# Patient Record
Sex: Female | Born: 1980 | Race: White | Hispanic: No | Marital: Single | Smoking: Former smoker
Health system: Southern US, Community
[De-identification: ages and names within clinical notes are randomized; demographics above are authoritative.]

## PROBLEM LIST (undated history)

## (undated) DIAGNOSIS — K859 Acute pancreatitis without necrosis or infection, unspecified: Secondary | ICD-10-CM

## (undated) HISTORY — PX: CHOLECYSTECTOMY: SHX55

## (undated) HISTORY — PX: TUBAL LIGATION: SHX77

---

## 2021-07-09 ENCOUNTER — Emergency Department (HOSPITAL_BASED_OUTPATIENT_CLINIC_OR_DEPARTMENT_OTHER)
Admission: EM | Admit: 2021-07-09 | Discharge: 2021-07-09 | Disposition: A | Discharge status: Home / Self Care | Payer: Self-pay | Attending: Emergency Medicine | Admitting: Emergency Medicine

## 2021-07-09 ENCOUNTER — Encounter (HOSPITAL_BASED_OUTPATIENT_CLINIC_OR_DEPARTMENT_OTHER): Payer: Self-pay | Admitting: *Deleted

## 2021-07-09 ENCOUNTER — Emergency Department (HOSPITAL_BASED_OUTPATIENT_CLINIC_OR_DEPARTMENT_OTHER): Payer: Self-pay

## 2021-07-09 ENCOUNTER — Other Ambulatory Visit: Payer: Self-pay

## 2021-07-09 DIAGNOSIS — K56699 Other intestinal obstruction unspecified as to partial versus complete obstruction: Secondary | ICD-10-CM

## 2021-07-09 DIAGNOSIS — X58XXXA Exposure to other specified factors, initial encounter: Secondary | ICD-10-CM | POA: Insufficient documentation

## 2021-07-09 DIAGNOSIS — Z20822 Contact with and (suspected) exposure to covid-19: Secondary | ICD-10-CM | POA: Insufficient documentation

## 2021-07-09 DIAGNOSIS — T18108A Unspecified foreign body in esophagus causing other injury, initial encounter: Secondary | ICD-10-CM | POA: Insufficient documentation

## 2021-07-09 HISTORY — DX: Acute pancreatitis without necrosis or infection, unspecified: K85.90

## 2021-07-09 LAB — CBC WITH DIFFERENTIAL/PLATELET
Abs Immature Granulocytes: 0 10*3/uL (ref 0.00–0.07)
Basophils Absolute: 0.1 10*3/uL (ref 0.0–0.1)
Basophils Relative: 1 %
Eosinophils Absolute: 1.5 10*3/uL — ABNORMAL HIGH (ref 0.0–0.5)
Eosinophils Relative: 27 %
HCT: 36.9 % (ref 36.0–46.0)
Hemoglobin: 12.1 g/dL (ref 12.0–15.0)
Immature Granulocytes: 0 %
Lymphocytes Relative: 30 %
Lymphs Abs: 1.6 10*3/uL (ref 0.7–4.0)
MCH: 28.5 pg (ref 26.0–34.0)
MCHC: 32.8 g/dL (ref 30.0–36.0)
MCV: 86.8 fL (ref 80.0–100.0)
Monocytes Absolute: 0.4 10*3/uL (ref 0.1–1.0)
Monocytes Relative: 7 %
Neutro Abs: 1.9 10*3/uL (ref 1.7–7.7)
Neutrophils Relative %: 35 %
Platelets: 295 10*3/uL (ref 150–400)
RBC: 4.25 MIL/uL (ref 3.87–5.11)
RDW: 14.5 % (ref 11.5–15.5)
WBC: 5.5 10*3/uL (ref 4.0–10.5)
nRBC: 0 % (ref 0.0–0.2)

## 2021-07-09 LAB — COMPREHENSIVE METABOLIC PANEL
ALT: 17 U/L (ref 0–44)
AST: 20 U/L (ref 15–41)
Albumin: 4.3 g/dL (ref 3.5–5.0)
Alkaline Phosphatase: 47 U/L (ref 38–126)
Anion gap: 10 (ref 5–15)
BUN: 11 mg/dL (ref 6–20)
CO2: 21 mmol/L — ABNORMAL LOW (ref 22–32)
Calcium: 9 mg/dL (ref 8.9–10.3)
Chloride: 103 mmol/L (ref 98–111)
Creatinine, Ser: 0.66 mg/dL (ref 0.44–1.00)
GFR, Estimated: >60 mL/min (ref >60)
Glucose, Bld: 136 mg/dL — ABNORMAL HIGH (ref 70–99)
Potassium: 3.1 mmol/L — ABNORMAL LOW (ref 3.5–5.1)
Sodium: 134 mmol/L — ABNORMAL LOW (ref 135–145)
Total Bilirubin: 0.4 mg/dL (ref 0.3–1.2)
Total Protein: 7.4 g/dL (ref 6.5–8.1)

## 2021-07-09 LAB — RESP PANEL BY RT-PCR (FLU A&B, COVID) ARPGX2
Influenza A by PCR: NEGATIVE
Influenza B by PCR: NEGATIVE
SARS Coronavirus 2 by RT PCR: NEGATIVE

## 2021-07-09 LAB — PREGNANCY, URINE: Preg Test, Ur: NEGATIVE

## 2021-07-09 MED ORDER — GLUCAGON HCL RDNA (DIAGNOSTIC) 1 MG IJ SOLR
1.0000 mg | Freq: Once | INTRAMUSCULAR | Status: AC
  Administered 2021-07-09: 1 mg via INTRAVENOUS
  Filled 2021-07-09: qty 1

## 2021-07-09 MED ORDER — METOCLOPRAMIDE HCL 5 MG/ML IJ SOLN
10.0000 mg | Freq: Once | INTRAMUSCULAR | Status: AC
  Administered 2021-07-09: 10 mg via INTRAVENOUS
  Filled 2021-07-09: qty 2

## 2021-07-09 MED ORDER — SODIUM CHLORIDE 0.9 % IV BOLUS
1000.0000 mL | Freq: Once | INTRAVENOUS | Status: AC
  Administered 2021-07-09: 1000 mL via INTRAVENOUS

## 2021-07-09 MED ORDER — DIPHENHYDRAMINE HCL 50 MG/ML IJ SOLN
50.0000 mg | Freq: Once | INTRAMUSCULAR | Status: AC
  Administered 2021-07-09: 50 mg via INTRAVENOUS
  Filled 2021-07-09: qty 1

## 2021-07-09 MED ORDER — LIDOCAINE VISCOUS HCL 2 % MT SOLN
15.0000 mL | Freq: Once | OROMUCOSAL | Status: DC
  Filled 2021-07-09: qty 15

## 2021-07-09 MED ORDER — ALUM & MAG HYDROXIDE-SIMETH 200-200-20 MG/5ML PO SUSP
30.0000 mL | Freq: Once | ORAL | Status: DC
  Filled 2021-07-09: qty 30

## 2021-07-09 NOTE — ED Triage Notes (Signed)
C/o meat stuck in throat x 30 mins

## 2021-07-09 NOTE — ED Provider Notes (Signed)
[facility] Provider Note   CSN: [id number] Arrival date & time: 07/09/21  1944     History  Chief Complaint  Patient presents with   Food Bolus    Morgan Best is a 41 y.o. female history of impaction requiring esophageal dilation here with possible food bolus.  Patient states that she was eating some barbecue and had some barbecue chicken and felt that part of it was stuck in her throat.  She try to gag herself and was able to get pieces out.  Patient states that she lives in [place] and required esophageal dilation previously for this.   The history is provided by the patient.      Home Medications Prior to Admission medications   Not on File      Allergies    Patient has no known allergies.    Review of Systems   Review of Systems  HENT:         Trouble swallowing  All other systems reviewed and are negative.  Physical Exam Updated Vital Signs BP (!) 125/54    Pulse 74    Temp 98.4 F (36.9 C) (Oral)    Resp 16    Ht 5\' 4"  (1.626 m)    Wt 75.3 kg    LMP 06/11/2021    SpO2 100%    BMI 28.49 kg/m  Physical Exam Vitals and nursing note reviewed.  Constitutional:      Comments: Uncomfortable  HENT:     Head: Normocephalic.     Nose: Nose normal.     Mouth/Throat:     Comments: No obvious foreign body posterior pharynx  Eyes:     Extraocular Movements: Extraocular movements intact.     Pupils: Pupils are equal, round, and reactive to light.  Cardiovascular:     Rate and Rhythm: Normal rate and regular rhythm.     Pulses: Normal pulses.     Heart sounds: Normal heart sounds.  Pulmonary:     Effort: Pulmonary effort is normal.     Breath sounds: Normal breath sounds.  Abdominal:     General: Abdomen is flat.     Palpations: Abdomen is soft.  Musculoskeletal:        General: Normal range of motion.     Cervical back: Normal range of motion and neck supple.  Skin:    General: Skin is warm.     Capillary Refill: Capillary  refill takes less than 2 seconds.  Neurological:     General: No focal deficit present.     Mental Status: She is alert and oriented to person, place, and time.  Psychiatric:        Mood and Affect: Mood normal.        Behavior: Behavior normal.    ED Results / Procedures / Treatments   Labs (all labs ordered are listed, but only abnormal results are displayed) Labs Reviewed  CBC WITH DIFFERENTIAL/PLATELET - Abnormal; Notable for the following components:      Result Value   Eosinophils Absolute 1.5 (*)    All other components within normal limits  RESP PANEL BY RT-PCR (FLU A&B, COVID) ARPGX2  PREGNANCY, URINE  COMPREHENSIVE METABOLIC PANEL    EKG None  Radiology DG Chest Port 1 View  Result Date: 07/09/2021 CLINICAL DATA:  Food bolus, choking. EXAM: PORTABLE CHEST 1 VIEW COMPARISON:  None. FINDINGS: The heart size and mediastinal contours are within normal limits. Both lungs are clear. The visualized skeletal structures  are unremarkable. IMPRESSION: No active disease. Electronically Signed   By: Ronney Asters M.D.   On: 07/09/2021 20:22    Procedures Procedures    Medications Ordered in ED Medications  alum & mag hydroxide-simeth (MAALOX/MYLANTA) 200-200-20 MG/5ML suspension 30 mL (has no administration in time range)    And  lidocaine (XYLOCAINE) 2 % viscous mouth solution 15 mL (has no administration in time range)  sodium chloride 0.9 % bolus 1,000 mL (1,000 mLs Intravenous New Bag/Given 07/09/21 2018)  metoCLOPramide (REGLAN) injection 10 mg (10 mg Intravenous Given 07/09/21 2018)  diphenhydrAMINE (BENADRYL) injection 50 mg (50 mg Intravenous Given 07/09/21 2020)  glucagon (human recombinant) (GLUCAGEN) injection 1 mg (1 mg Intravenous Given 07/09/21 2021)    ED Course/ Medical Decision Making/ A&P                           Medical Decision Making Morgan Best is a 41 y.o. female here presenting with possible food bolus.  This is a recurrent problem for her.  She  had previous food bolus requiring esophageal dilation.  She swallowed some pieces of chicken. Plan to give glucagon and Reglan and Benadryl and get chest x-ray and reassess  9:22 PM I reassessed patient.  Patient was able to tolerate ginger ale after glucagon and Reglan and Benadryl Patient has follow-up with GI at [facility].  Stable for discharge  Amount and/or Complexity of Data Reviewed Labs: ordered. Decision-making details documented in ED Course. Radiology: ordered and independent interpretation performed.    Details: no obvious foreign body on CXR       Final Clinical Impression(s) / ED Diagnoses Final diagnoses:  None    Rx / DC Orders ED Discharge Orders     None         Drenda Freeze, MD 07/09/21 2123

## 2021-07-09 NOTE — ED Notes (Signed)
ED Provider at bedside. 

## 2021-07-09 NOTE — Discharge Instructions (Signed)
You had a food bolus that passed.  Please eat soft diet for several day  Follow-up with your GI doctor in [place]  Return to ER if you have food stuck in your throat, trouble swallowing, chest pain

## 2021-07-09 NOTE — ED Notes (Signed)
Pt ambulatory to bathroom, no assistance needed.  

## 2021-07-09 NOTE — ED Notes (Signed)
Pt requested Ginger ale for fluid challenge. Pt given the same

## 2022-03-24 ENCOUNTER — Other Ambulatory Visit: Payer: Self-pay

## 2022-03-24 ENCOUNTER — Encounter (HOSPITAL_BASED_OUTPATIENT_CLINIC_OR_DEPARTMENT_OTHER): Payer: Self-pay | Admitting: *Deleted

## 2022-03-24 ENCOUNTER — Emergency Department (HOSPITAL_BASED_OUTPATIENT_CLINIC_OR_DEPARTMENT_OTHER)
Admission: EM | Admit: 2022-03-24 | Discharge: 2022-03-24 | Disposition: A | Discharge status: Home / Self Care | Payer: Medicaid Other | Attending: Emergency Medicine | Admitting: Emergency Medicine

## 2022-03-24 DIAGNOSIS — R300 Dysuria: Secondary | ICD-10-CM | POA: Diagnosis not present

## 2022-03-24 DIAGNOSIS — N898 Other specified noninflammatory disorders of vagina: Secondary | ICD-10-CM | POA: Insufficient documentation

## 2022-03-24 DIAGNOSIS — M545 Low back pain, unspecified: Secondary | ICD-10-CM | POA: Insufficient documentation

## 2022-03-24 DIAGNOSIS — R319 Hematuria, unspecified: Secondary | ICD-10-CM | POA: Diagnosis not present

## 2022-03-24 LAB — URINALYSIS, ROUTINE W REFLEX MICROSCOPIC
Bilirubin Urine: NEGATIVE
Glucose, UA: NEGATIVE mg/dL
Ketones, ur: NEGATIVE mg/dL
Leukocytes,Ua: NEGATIVE
Nitrite: NEGATIVE
Protein, ur: NEGATIVE mg/dL
Specific Gravity, Urine: 1.01 (ref 1.005–1.030)
pH: 5.5 (ref 5.0–8.0)

## 2022-03-24 LAB — WET PREP, GENITAL
Clue Cells Wet Prep HPF POC: NONE SEEN
Sperm: NONE SEEN
Trich, Wet Prep: NONE SEEN
WBC, Wet Prep HPF POC: <10 (ref <10)
Yeast Wet Prep HPF POC: NONE SEEN

## 2022-03-24 LAB — PREGNANCY, URINE: Preg Test, Ur: NEGATIVE

## 2022-03-24 NOTE — ED Triage Notes (Signed)
Pt reports that she thinks she has a UTI, she reports some discomfort with urination x4 days, some itching and some lower back pain.  Pt also reports some blood in the urine.

## 2022-03-24 NOTE — ED Provider Notes (Signed)
[facility] EMERGENCY DEPT Provider Note   CSN: [id number] Arrival date & time: 03/24/22  1348     History  Chief Complaint  Patient presents with   Urinary Tract Infection    Morgan Best is a 41 y.o. female.  Morgan Best is a 41 y.o. female with a history of pancreatitis and abnormal Pap smear, who presents to the ED with concern for possible UTI.  She reports for the past 4 days she has had some burning and discomfort with urination as well as some itching.  She reports she has had some occasional stringy vaginal discharge but no vaginal bleeding.  She has seen some blood in her urine and blood when she wipes after urinating, no rectal bleeding.  She is also had some mild low back pain.  No abdominal or flank pain.  Reports that her last menstrual cycle was about a week ago and was normal.  She denies concern for STD reports she has not been sexually active in 10 months.  Had a Pap smear in May that showed some abnormal cells and has a follow-up colposcopy and was told to follow-up for repeat Pap smear in a year.  The history is provided by the patient and medical records.  Urinary Tract Infection Associated symptoms: no abdominal pain, no fever, no flank pain, no nausea and no vomiting        Home Medications Prior to Admission medications   Not on File      Allergies    Patient has no known allergies.    Review of Systems   Review of Systems  Constitutional:  Negative for chills and fever.  Gastrointestinal:  Negative for abdominal pain, nausea and vomiting.  Genitourinary:  Positive for dysuria and hematuria. Negative for flank pain and frequency.  Musculoskeletal:  Positive for back pain.    Physical Exam Updated Vital Signs BP (!) 104/59 (BP Location: Left Arm)   Pulse (!) 54   Temp 97.7 F (36.5 C)   Resp 16   Wt 75.3 kg   LMP 03/13/2022 (Exact Date)   SpO2 100%   BMI 28.49 kg/m  Physical Exam Vitals and nursing note reviewed.   Constitutional:      General: She is not in acute distress.    Appearance: Normal appearance. She is well-developed. She is not diaphoretic.  HENT:     Head: Normocephalic and atraumatic.  Eyes:     General:        Right eye: No discharge.        Left eye: No discharge.  Cardiovascular:     Rate and Rhythm: Normal rate and regular rhythm.     Pulses: Normal pulses.     Heart sounds: Normal heart sounds.  Pulmonary:     Effort: Pulmonary effort is normal. No respiratory distress.     Breath sounds: Normal breath sounds. No wheezing or rales.     Comments: Respirations equal and unlabored, patient able to speak in full sentences, lungs clear to auscultation bilaterally  Abdominal:     General: Bowel sounds are normal. There is no distension.     Palpations: Abdomen is soft. There is no mass.     Tenderness: There is no abdominal tenderness. There is no right CVA tenderness, left CVA tenderness or guarding.     Comments: Abdomen soft, nondistended, nontender to palpation in all quadrants without guarding or peritoneal signs  Genitourinary:    Comments: Chaperone present during pelvic exam. External genitalia appears  normal. Vagina and cervix appear normal, there is a small amount of stringy clear discharge present, no bleeding, no cervical lesions noted. Musculoskeletal:        General: No deformity.     Cervical back: Neck supple.     Comments: Mild tenderness across the low back without overlying skin changes or palpable warmth, normal range of motion of lower extremities  Skin:    General: Skin is warm and dry.     Capillary Refill: Capillary refill takes less than 2 seconds.  Neurological:     Mental Status: She is alert and oriented to person, place, and time.     Coordination: Coordination normal.     Comments: Speech is clear, able to follow commands Moves extremities without ataxia, coordination intact  Psychiatric:        Mood and Affect: Mood normal.        Behavior:  Behavior normal.     ED Results / Procedures / Treatments   Labs (all labs ordered are listed, but only abnormal results are displayed) Labs Reviewed  URINALYSIS, ROUTINE W REFLEX MICROSCOPIC - Abnormal; Notable for the following components:      Result Value   Color, Urine COLORLESS (*)    Hgb urine dipstick SMALL (*)    Bacteria, UA RARE (*)    All other components within normal limits  WET PREP, GENITAL  URINE CULTURE  PREGNANCY, URINE    EKG None  Radiology No results found.  Procedures Procedures    Medications Ordered in ED Medications - No data to display  ED Course/ Medical Decision Making/ A&P                           Medical Decision Making Amount and/or Complexity of Data Reviewed Labs: ordered.   41 year old female presents with some dysuria and low back pain for the past 4 days as well as some itching.  Has seen some blood in her urine and when wiping, no rectal bleeding.  Differential includes UTI, pyelonephritis, STI, yeast infection, BV, musculoskeletal back pain.  No abdominal tenderness or CVA tenderness.  On pelvic exam patient has a small amount of thin stringy discharge, not sexually active and no concern for STI, no cervical lesions or vaginal bleeding.  Urinalysis without clear signs of infection at this time, some rare bacteria but no leukocytes, nitrates, WBCs or RBCs.  Will send for culture.  Wet prep with no evidence of yeast, BV or WBCs.  I discussed overall reassuring results with patient.  Culture is pending.  If symptoms or not resolving in the next few days we will have her follow-up with her regular doctor or OB/GYN for further evaluation.  Discussed return precautions.  At this time there does not appear to be any evidence of an acute emergency medical condition requiring further emergent evaluation and the patient appears stable for discharge with appropriate outpatient follow up. Diagnosis and return precautions discussed with patient  who verbalizes understanding and is agreeable to discharge.           Final Clinical Impression(s) / ED Diagnoses Final diagnoses:  Dysuria    Rx / DC Orders ED Discharge Orders     None         Jacqlyn Larsen, Hershal Coria 77/82/42 3536    Campbell Stall P, DO 14/43/15 [phone]

## 2022-03-24 NOTE — Discharge Instructions (Signed)
Your urine did not show signs of infection and your wet prep was not positive for yeast or bacterial vaginosis.  We did send your urinalysis for culture and you will be called by phone if it grows out something that requires treatment with antibiotics otherwise follow-up with your primary care doctor or OB/GYN if symptoms continue.

## 2022-03-25 LAB — URINE CULTURE: Culture: <10000 — AB

## 2022-04-21 ENCOUNTER — Emergency Department (HOSPITAL_BASED_OUTPATIENT_CLINIC_OR_DEPARTMENT_OTHER)
Admission: EM | Admit: 2022-04-21 | Discharge: 2022-04-21 | Disposition: A | Discharge status: Home / Self Care | Payer: Commercial Managed Care - HMO | Attending: Emergency Medicine | Admitting: Emergency Medicine

## 2022-04-21 ENCOUNTER — Other Ambulatory Visit: Payer: Self-pay

## 2022-04-21 ENCOUNTER — Other Ambulatory Visit (HOSPITAL_BASED_OUTPATIENT_CLINIC_OR_DEPARTMENT_OTHER): Payer: Self-pay

## 2022-04-21 ENCOUNTER — Encounter (HOSPITAL_BASED_OUTPATIENT_CLINIC_OR_DEPARTMENT_OTHER): Payer: Self-pay | Admitting: Emergency Medicine

## 2022-04-21 DIAGNOSIS — M545 Low back pain, unspecified: Secondary | ICD-10-CM | POA: Insufficient documentation

## 2022-04-21 MED ORDER — DIAZEPAM 2 MG PO TABS
2.0000 mg | ORAL_TABLET | Freq: Once | ORAL | Status: AC
  Administered 2022-04-21: 2 mg via ORAL
  Filled 2022-04-21: qty 1

## 2022-04-21 MED ORDER — ACETAMINOPHEN 500 MG PO TABS
1000.0000 mg | ORAL_TABLET | Freq: Once | ORAL | Status: AC
  Administered 2022-04-21: 1000 mg via ORAL
  Filled 2022-04-21: qty 2

## 2022-04-21 MED ORDER — DICLOFENAC SODIUM 1 % EX GEL
4.0000 g | Freq: Four times a day (QID) | CUTANEOUS | 0 refills | Status: AC
  Filled 2022-04-21: qty 100, 7d supply, fill #0

## 2022-04-21 MED ORDER — KETOROLAC TROMETHAMINE 15 MG/ML IJ SOLN
15.0000 mg | Freq: Once | INTRAMUSCULAR | Status: AC
  Administered 2022-04-21: 15 mg via INTRAMUSCULAR
  Filled 2022-04-21: qty 1

## 2022-04-21 MED ORDER — METHYLPREDNISOLONE 4 MG PO TBPK
ORAL_TABLET | ORAL | 0 refills | Status: AC
  Filled 2022-04-21: qty 21, 6d supply, fill #0

## 2022-04-21 MED ORDER — METHOCARBAMOL 500 MG PO TABS
500.0000 mg | ORAL_TABLET | Freq: Two times a day (BID) | ORAL | 0 refills | Status: AC
  Filled 2022-04-21: qty 5, 3d supply, fill #0

## 2022-04-21 MED ORDER — OXYCODONE HCL 5 MG PO TABS
5.0000 mg | ORAL_TABLET | Freq: Once | ORAL | Status: AC
Start: 2022-04-21 — End: 2022-04-21
  Administered 2022-04-21: 5 mg via ORAL
  Filled 2022-04-21: qty 1

## 2022-04-21 NOTE — ED Provider Notes (Signed)
[place] EMERGENCY DEPT Provider Note   CSN: [id number] Arrival date & time: 04/21/22  1102     History  No chief complaint on file.   Morgan Best is a 41 y.o. female.  41 yo F with a chief complaint of low back pain.  This has been going on for at least 3 weeks maybe a bit longer.  Worse with prolonged sitting certain positions.  Has tried ibuprofen without improvement.  Has tried Tiger balm without improvement.  Denies radiation of the pain denies abdominal pain denies urinary symptoms denies loss of bowel or bladder denies loss or perirectal sensation denies numbness or weakness of the legs.        Home Medications Prior to Admission medications   Medication Sig Start Date End Date Taking? Authorizing Provider  diclofenac Sodium (VOLTAREN) 1 % GEL Apply 4 grams topically 4 (four) times daily. 04/21/22  Yes Deno Etienne, DO  methocarbamol (ROBAXIN) 500 MG tablet Take 1 tablet (500 mg total) by mouth 2 (two) times daily. 04/21/22  Yes Deno Etienne, DO  methylPREDNISolone (MEDROL DOSEPAK) 4 MG TBPK tablet Take as directed on package. 04/21/22  Yes Deno Etienne, DO      Allergies    Patient has no known allergies.    Review of Systems   Review of Systems  Physical Exam Updated Vital Signs BP 121/68 (BP Location: Right Arm)   Pulse (!) 58   Temp 97.6 F (36.4 C) (Oral)   Resp 16   Ht 5\' 4"  (1.626 m)   Wt 81.6 kg   LMP 04/07/2022 (Exact Date)   SpO2 100%   BMI 30.90 kg/m  Physical Exam Vitals and nursing note reviewed.  Constitutional:      General: She is not in acute distress.    Appearance: She is well-developed. She is not diaphoretic.  HENT:     Head: Normocephalic and atraumatic.  Eyes:     Pupils: Pupils are equal, round, and reactive to light.  Cardiovascular:     Rate and Rhythm: Normal rate and regular rhythm.     Heart sounds: No murmur heard.    No friction rub. No gallop.  Pulmonary:     Effort: Pulmonary effort is normal.      Breath sounds: No wheezing or rales.  Abdominal:     General: There is no distension.     Palpations: Abdomen is soft.     Tenderness: There is no abdominal tenderness.  Musculoskeletal:        General: No tenderness.     Cervical back: Normal range of motion and neck supple.     Comments: No midline spinal tenderness step-offs or deformities.  She has some mild pain to the paraspinal musculature worse on the left than the right.  Pulse motor and sensation intact in bilateral lower extremities.  Reflexes 2+ and equal.  No clonus.  Skin:    General: Skin is warm and dry.  Neurological:     Mental Status: She is alert and oriented to person, place, and time.  Psychiatric:        Behavior: Behavior normal.     ED Results / Procedures / Treatments   Labs (all labs ordered are listed, but only abnormal results are displayed) Labs Reviewed - No data to display  EKG None  Radiology No results found.  Procedures Procedures    Medications Ordered in ED Medications  acetaminophen (TYLENOL) tablet 1,000 mg (has no administration in time range)  oxyCODONE (  Oxy IR/ROXICODONE) immediate release tablet 5 mg (has no administration in time range)  ketorolac (TORADOL) 15 MG/ML injection 15 mg (has no administration in time range)  diazepam (VALIUM) tablet 2 mg (has no administration in time range)    ED Course/ Medical Decision Making/ A&P                           Medical Decision Making Risk OTC drugs. Prescription drug management.   41 yo F with a cc of low back pain.  This has been going on for almost a month.  No red flags.  We will treat as musculoskeletal.  PCP follow-up.  1:03 PM:  I have discussed the diagnosis/risks/treatment options with the patient.  Evaluation and diagnostic testing in the emergency department does not suggest an emergent condition requiring admission or immediate intervention beyond what has been performed at this time.  They will follow up with PCP.  We also discussed returning to the ED immediately if new or worsening sx occur. We discussed the sx which are most concerning (e.g., sudden worsening pain, fever, inability to tolerate by mouth) that necessitate immediate return. Medications administered to the patient during their visit and any new prescriptions provided to the patient are listed below.  Medications given during this visit Medications  acetaminophen (TYLENOL) tablet 1,000 mg (has no administration in time range)  oxyCODONE (Oxy IR/ROXICODONE) immediate release tablet 5 mg (has no administration in time range)  ketorolac (TORADOL) 15 MG/ML injection 15 mg (has no administration in time range)  diazepam (VALIUM) tablet 2 mg (has no administration in time range)     The patient appears reasonably screen and/or stabilized for discharge and I doubt any other medical condition or other [facility] requiring further screening, evaluation, or treatment in the ED at this time prior to discharge.          Final Clinical Impression(s) / ED Diagnoses Final diagnoses:  Acute bilateral low back pain without sciatica    Rx / DC Orders ED Discharge Orders          Ordered    methylPREDNISolone (MEDROL DOSEPAK) 4 MG TBPK tablet        04/21/22 1244    methocarbamol (ROBAXIN) 500 MG tablet  2 times daily        04/21/22 1244    diclofenac Sodium (VOLTAREN) 1 % GEL  4 times daily        04/21/22 Raymond, Terrilynn Postell, DO 04/21/22 1303

## 2022-04-21 NOTE — ED Triage Notes (Signed)
Pt via pov from home with lower back pain x 1 month, worsening today. Pt has taken ibuprofen, ice, heat - no relief. Pt denies any trauma or injury. A&O x 4; nad noted.

## 2022-04-21 NOTE — ED Notes (Signed)
Discharge paperwork given and verbally understood. Pt understood the medications that were given may make her feel sleepy. She also understood no driving/drinking or operating heavy equipment.

## 2022-04-21 NOTE — Discharge Instructions (Signed)
Your back pain is most likely due to a muscular strain.  There is been a lot of research on back pain, unfortunately the only thing that seems to really help is Tylenol and ibuprofen.  Relative rest is also important to not lift greater than 10 pounds bending or twisting at the waist.  Please follow-up with your family physician.  The other thing that really seems to benefit patients is physical therapy which your doctor may send you for.  Please return to the emergency department for new numbness or weakness to your arms or legs. Difficulty with urinating or urinating or pooping on yourself.  Also if you cannot feel toilet paper when you wipe or get a fever.   Take the steroids and use the gel as prescribed.  You should not take oral ibuprofen or naproxen while you are taking the steroids. Also take tylenol 1000mg (2 extra strength) four times a day.   [email or web address]

## 2022-10-14 ENCOUNTER — Emergency Department (HOSPITAL_BASED_OUTPATIENT_CLINIC_OR_DEPARTMENT_OTHER)
Admission: EM | Admit: 2022-10-14 | Discharge: 2022-10-14 | Disposition: A | Discharge status: Home / Self Care | Payer: Commercial Managed Care - HMO | Attending: Emergency Medicine | Admitting: Emergency Medicine

## 2022-10-14 ENCOUNTER — Emergency Department (HOSPITAL_BASED_OUTPATIENT_CLINIC_OR_DEPARTMENT_OTHER): Payer: Commercial Managed Care - HMO

## 2022-10-14 ENCOUNTER — Encounter (HOSPITAL_BASED_OUTPATIENT_CLINIC_OR_DEPARTMENT_OTHER): Payer: Self-pay

## 2022-10-14 ENCOUNTER — Other Ambulatory Visit: Payer: Self-pay

## 2022-10-14 DIAGNOSIS — N898 Other specified noninflammatory disorders of vagina: Secondary | ICD-10-CM | POA: Diagnosis not present

## 2022-10-14 DIAGNOSIS — D649 Anemia, unspecified: Secondary | ICD-10-CM | POA: Insufficient documentation

## 2022-10-14 DIAGNOSIS — R110 Nausea: Secondary | ICD-10-CM | POA: Diagnosis not present

## 2022-10-14 DIAGNOSIS — R1032 Left lower quadrant pain: Secondary | ICD-10-CM | POA: Diagnosis not present

## 2022-10-14 DIAGNOSIS — R1031 Right lower quadrant pain: Secondary | ICD-10-CM | POA: Insufficient documentation

## 2022-10-14 DIAGNOSIS — R1030 Lower abdominal pain, unspecified: Secondary | ICD-10-CM

## 2022-10-14 DIAGNOSIS — N9489 Other specified conditions associated with female genital organs and menstrual cycle: Secondary | ICD-10-CM | POA: Diagnosis not present

## 2022-10-14 LAB — BASIC METABOLIC PANEL
Anion gap: 9 (ref 5–15)
BUN: 11 mg/dL (ref 6–20)
CO2: 25 mmol/L (ref 22–32)
Calcium: 9.5 mg/dL (ref 8.9–10.3)
Chloride: 103 mmol/L (ref 98–111)
Creatinine, Ser: 0.56 mg/dL (ref 0.44–1.00)
GFR, Estimated: >60 mL/min (ref >60)
Glucose, Bld: 88 mg/dL (ref 70–99)
Potassium: 4.1 mmol/L (ref 3.5–5.1)
Sodium: 137 mmol/L (ref 135–145)

## 2022-10-14 LAB — CBC
HCT: 33 % — ABNORMAL LOW (ref 36.0–46.0)
Hemoglobin: 10.1 g/dL — ABNORMAL LOW (ref 12.0–15.0)
MCH: 25.5 pg — ABNORMAL LOW (ref 26.0–34.0)
MCHC: 30.6 g/dL (ref 30.0–36.0)
MCV: 83.3 fL (ref 80.0–100.0)
Platelets: 332 10*3/uL (ref 150–400)
RBC: 3.96 MIL/uL (ref 3.87–5.11)
RDW: 15.1 % (ref 11.5–15.5)
WBC: 6.4 10*3/uL (ref 4.0–10.5)
nRBC: 0 % (ref 0.0–0.2)

## 2022-10-14 LAB — URINALYSIS, ROUTINE W REFLEX MICROSCOPIC
Bilirubin Urine: NEGATIVE
Glucose, UA: NEGATIVE mg/dL
Hgb urine dipstick: NEGATIVE
Ketones, ur: NEGATIVE mg/dL
Leukocytes,Ua: NEGATIVE
Nitrite: NEGATIVE
Protein, ur: NEGATIVE mg/dL
Specific Gravity, Urine: 1.014 (ref 1.005–1.030)
Squamous Epithelial / HPF: >50 /HPF (ref 0–5)
pH: 5.5 (ref 5.0–8.0)

## 2022-10-14 LAB — WET PREP, GENITAL
Clue Cells Wet Prep HPF POC: NONE SEEN
Sperm: NONE SEEN
Trich, Wet Prep: NONE SEEN
WBC, Wet Prep HPF POC: <10 (ref <10)
Yeast Wet Prep HPF POC: NONE SEEN

## 2022-10-14 LAB — LIPASE, BLOOD: Lipase: <10 U/L — ABNORMAL LOW (ref 11–51)

## 2022-10-14 LAB — PREGNANCY, URINE: Preg Test, Ur: NEGATIVE

## 2022-10-14 MED ORDER — KETOROLAC TROMETHAMINE 15 MG/ML IJ SOLN
15.0000 mg | Freq: Once | INTRAMUSCULAR | Status: AC
  Administered 2022-10-14: 15 mg via INTRAVENOUS
  Filled 2022-10-14: qty 1

## 2022-10-14 MED ORDER — HYDROCODONE-ACETAMINOPHEN 5-325 MG PO TABS
1.0000 | ORAL_TABLET | Freq: Once | ORAL | Status: AC
  Administered 2022-10-14: 1 via ORAL
  Filled 2022-10-14: qty 1

## 2022-10-14 MED ORDER — IOHEXOL 300 MG/ML  SOLN
100.0000 mL | Freq: Once | INTRAMUSCULAR | Status: AC | PRN
  Administered 2022-10-14: 100 mL via INTRAVENOUS

## 2022-10-14 NOTE — Discharge Instructions (Addendum)
Please follow-up with your primary care doctor, your uterus appears to be swollen today, you may be starting your period soon.  Make sure you return to the ER if you feel like your pain is getting worse, talk to your gynecologist, you may possibly going through perimenopause, however it is unclear at this time.

## 2022-10-14 NOTE — ED Notes (Signed)
Patient transported to CT 

## 2022-10-14 NOTE — ED Notes (Signed)
RN reviewed discharge instructions with pt. Pt verbalized understanding and had no further questions. VSS upon discharge.  

## 2022-10-14 NOTE — ED Provider Notes (Signed)
[facility] AT [facility] Provider Note   CSN: [id number] Arrival date & time: 10/14/22  1253     History  Chief Complaint  Patient presents with   Flank Pain    Morgan Best is a 42 y.o. female, history of pancreatitis, cholecystectomy, who presents to the ED secondary to bilateral lower quadrant abdominal pain radiating to the right side of her back for the last 4 days.  She states initially was intermittent at first, but now has been persistent and has caused severe pain.  She states that she went and saw her doctor at [facility], and was told to come to the ER as they were not able to get a CT scan sooner.  She denies any increased frequency urgency of urination.  Does endorse some vaginal discharge and is concerned that she may have BV, request BV testing.  She states the pain is sharp and stabbing, radiating to bilateral lower quadrants and the back.  States it hurts most in the right back.  Denies history of kidney stones.  Denies concern for STDs--is only sexually active with 1 partner and is not in a monogamous relationship.  Denies any kind of fever, chills, vomiting but does endorse nausea.     Home Medications Prior to Admission medications   Medication Sig Start Date End Date Taking? Authorizing Provider  diclofenac Sodium (VOLTAREN) 1 % GEL Apply 4 grams topically 4 (four) times daily. 04/21/22   Melene Plan, DO  methocarbamol (ROBAXIN) 500 MG tablet Take 1 tablet (500 mg total) by mouth 2 (two) times daily. 04/21/22   Melene Plan, DO  methylPREDNISolone (MEDROL DOSEPAK) 4 MG TBPK tablet Take as directed on package. 04/21/22   Melene Plan, DO      Allergies    Patient has no known allergies.    Review of Systems   Review of Systems  Gastrointestinal:  Negative for vomiting.  Genitourinary:  Positive for flank pain. Negative for difficulty urinating and dysuria.    Physical Exam Updated Vital Signs BP 103/61 (BP Location: Right Wrist)    Pulse 62   Temp (!) 97.5 F (36.4 C) (Oral)   Resp 19   Ht 5\' 4"  (1.626 m)   Wt 102.1 kg   SpO2 100%   BMI 38.62 kg/m  Physical Exam Vitals and nursing note reviewed.  Constitutional:      General: She is not in acute distress.    Appearance: She is well-developed.  HENT:     Head: Normocephalic and atraumatic.  Eyes:     Conjunctiva/sclera: Conjunctivae normal.  Cardiovascular:     Rate and Rhythm: Normal rate and regular rhythm.     Heart sounds: No murmur heard. Pulmonary:     Effort: Pulmonary effort is normal. No respiratory distress.     Breath sounds: Normal breath sounds.  Abdominal:     Palpations: Abdomen is soft.     Tenderness: There is abdominal tenderness in the right lower quadrant and left lower quadrant. There is right CVA tenderness. There is no guarding or rebound.  Musculoskeletal:        General: No swelling.     Cervical back: Neck supple.  Skin:    General: Skin is warm and dry.     Capillary Refill: Capillary refill takes less than 2 seconds.  Neurological:     Mental Status: She is alert.  Psychiatric:        Mood and Affect: Mood normal.     ED  Results / Procedures / Treatments   Labs (all labs ordered are listed, but only abnormal results are displayed) Labs Reviewed  URINALYSIS, ROUTINE W REFLEX MICROSCOPIC - Abnormal; Notable for the following components:      Result Value   APPearance HAZY (*)    Bacteria, UA RARE (*)    All other components within normal limits  CBC - Abnormal; Notable for the following components:   Hemoglobin 10.1 (*)    HCT 33.0 (*)    MCH 25.5 (*)    All other components within normal limits  LIPASE, BLOOD - Abnormal; Notable for the following components:   Lipase <10 (*)    All other components within normal limits  WET PREP, GENITAL  PREGNANCY, URINE  BASIC METABOLIC PANEL    EKG None  Radiology CT ABDOMEN PELVIS W CONTRAST  Result Date: 10/14/2022 CLINICAL DATA:  Right lower quadrant abdominal  pain beginning 4 5 days ago. EXAM: CT ABDOMEN AND PELVIS WITH CONTRAST TECHNIQUE: Multidetector CT imaging of the abdomen and pelvis was performed using the standard protocol following bolus administration of intravenous contrast. RADIATION DOSE REDUCTION: This exam was performed according to the departmental dose-optimization program which includes automated exposure control, adjustment of the mA and/or kV according to patient size and/or use of iterative reconstruction technique. CONTRAST:  OMNIPAQUE IOHEXOL 300 MG/ML  SOLN COMPARISON:  None Available. FINDINGS: Lower chest: The lung bases are clear without focal nodule, mass, or airspace disease. Heart size is normal. No significant pleural or pericardial effusion is present. Hepatobiliary: No focal liver abnormality is seen. Status post cholecystectomy. No biliary dilatation. Pancreas: Unremarkable. No pancreatic ductal dilatation or surrounding inflammatory changes. Spleen: Normal in size without focal abnormality. Adrenals/Urinary Tract: Adrenal glands are normal bilaterally. A 10 mm simple cyst is present in the left kidney. A 8 mm simple cyst is present the right kidney. No other focal lesions are present. No stone or obstruction is present. The ureters are within normal limits bilaterally. The urinary bladder is normal. Stomach/Bowel: Stomach is within normal limits. Appendix appears normal. No evidence of bowel wall thickening, distention, or inflammatory changes. Vascular/Lymphatic: No significant vascular findings are present. No enlarged abdominal or pelvic lymph nodes. Reproductive: Fluid is present in the endometrial canal. Edematous changes are present in the uterus. Head necks are unremarkable. A Morgan Best amount of free fluid is present in the cul-de-sac. Correlate with menstrual cycle. Other: No other significant free fluid is present. A paraumbilical hernia contains fat without bowel. No free air is present. Musculoskeletal: The vertebral body  heights and alignment are normal. No focal osseous lesions are present. The hips are located and within normal limits. IMPRESSION: 1. Edematous changes in the uterus with fluid in the endometrial canal. Correlate with menstrual cycle. 2. Morgan Best amount of free fluid in the cul-de-sac is likely physiologic. 3. Normal appearance of the appendix. 4. No other acute or focal lesion to explain the patient's symptoms. Electronically Signed   By: Marin Roberts M.D.   On: 10/14/2022 17:03    Procedures Procedures    Medications Ordered in ED Medications  ketorolac (TORADOL) 15 MG/ML injection 15 mg (15 mg Intravenous Given 10/14/22 1607)  iohexol (OMNIPAQUE) 300 MG/ML solution 100 mL (100 mLs Intravenous Contrast Given 10/14/22 1613)  HYDROcodone-acetaminophen (NORCO/VICODIN) 5-325 MG per tablet 1 tablet (1 tablet Oral Given 10/14/22 1647)    ED Course/ Medical Decision Making/ A&P  Medical Decision Making Patient is a 42 year old female, here for bilateral lower quadrant pain radiating to the back, for the last 4 days.  States that she was told to come here given her low hemoglobin, by her primary care doctor, as well as to get a CT scan.  She denies any urinary symptoms.  Denies any concern for STDs.  We will obtain a CT scan, as well as basic labs and wet prep for further evaluation.  She is overall well-appearing on exam, has some right-sided back pain, and bilateral lower quadrant pain, without any guarding.  She is well-appearing.  Amount and/or Complexity of Data Reviewed Labs: ordered.    Details: Hemoglobin 10.1 (labs from earlier this week were 9.3 hemoglobin) Radiology: ordered.    Details: CT abdomen pelvis, shows edematous uterus. Discussion of management or test interpretation with external provider(s): Discussed with patient, she has no cervical motion tenderness, she is low risk for STDs that she is monogamous relationship, she does have some edema of her  uterus, per the CT scan.  Her symptoms have been ongoing for 4 days, thus I think torsion is unlikely and she has no signs of guarding or rebound. In not acute distress.  We discussed that she may be menstruating again upcoming, last period was 4/4.  She states she has had irregular periods, night sweats, hot flashes, I encouraged her to follow-up with her OB/GYN for further evaluation, ?perimenopausal?  We discussed using warm water bottle for pain control, and Tylenol and ibuprofen.  Risk Prescription drug management.    Final Clinical Impression(s) / ED Diagnoses Final diagnoses:  Lower abdominal pain  Uterine pain    Rx / DC Orders ED Discharge Orders     None         Tashera Montalvo, [place]ley [place], PA 10/14/22 1733    Arby Barrette, MD 10/15/22 1135

## 2022-10-14 NOTE — ED Triage Notes (Signed)
Patient here POV from Home.  Endorses Right Flank Pain that began 4-5 Days ago. Worsened today. No Discernable Dysuria and some Mild hematuria. No known fevers.   Seen by PCP 2 Days ago and was told she was anemic and possibly had a Renal Stone due to her UA was normal.   NAD Noted during Triage. A&Ox4. GCS 15. Ambulatory.

## 2023-02-21 IMAGING — DX DG CHEST 1V PORT
1 series · 1 of 1 positions shown · non-contrast
Comparison: None.

CLINICAL DATA: Food bolus, choking.

EXAM:
PORTABLE CHEST 1 VIEW

[chest ap]
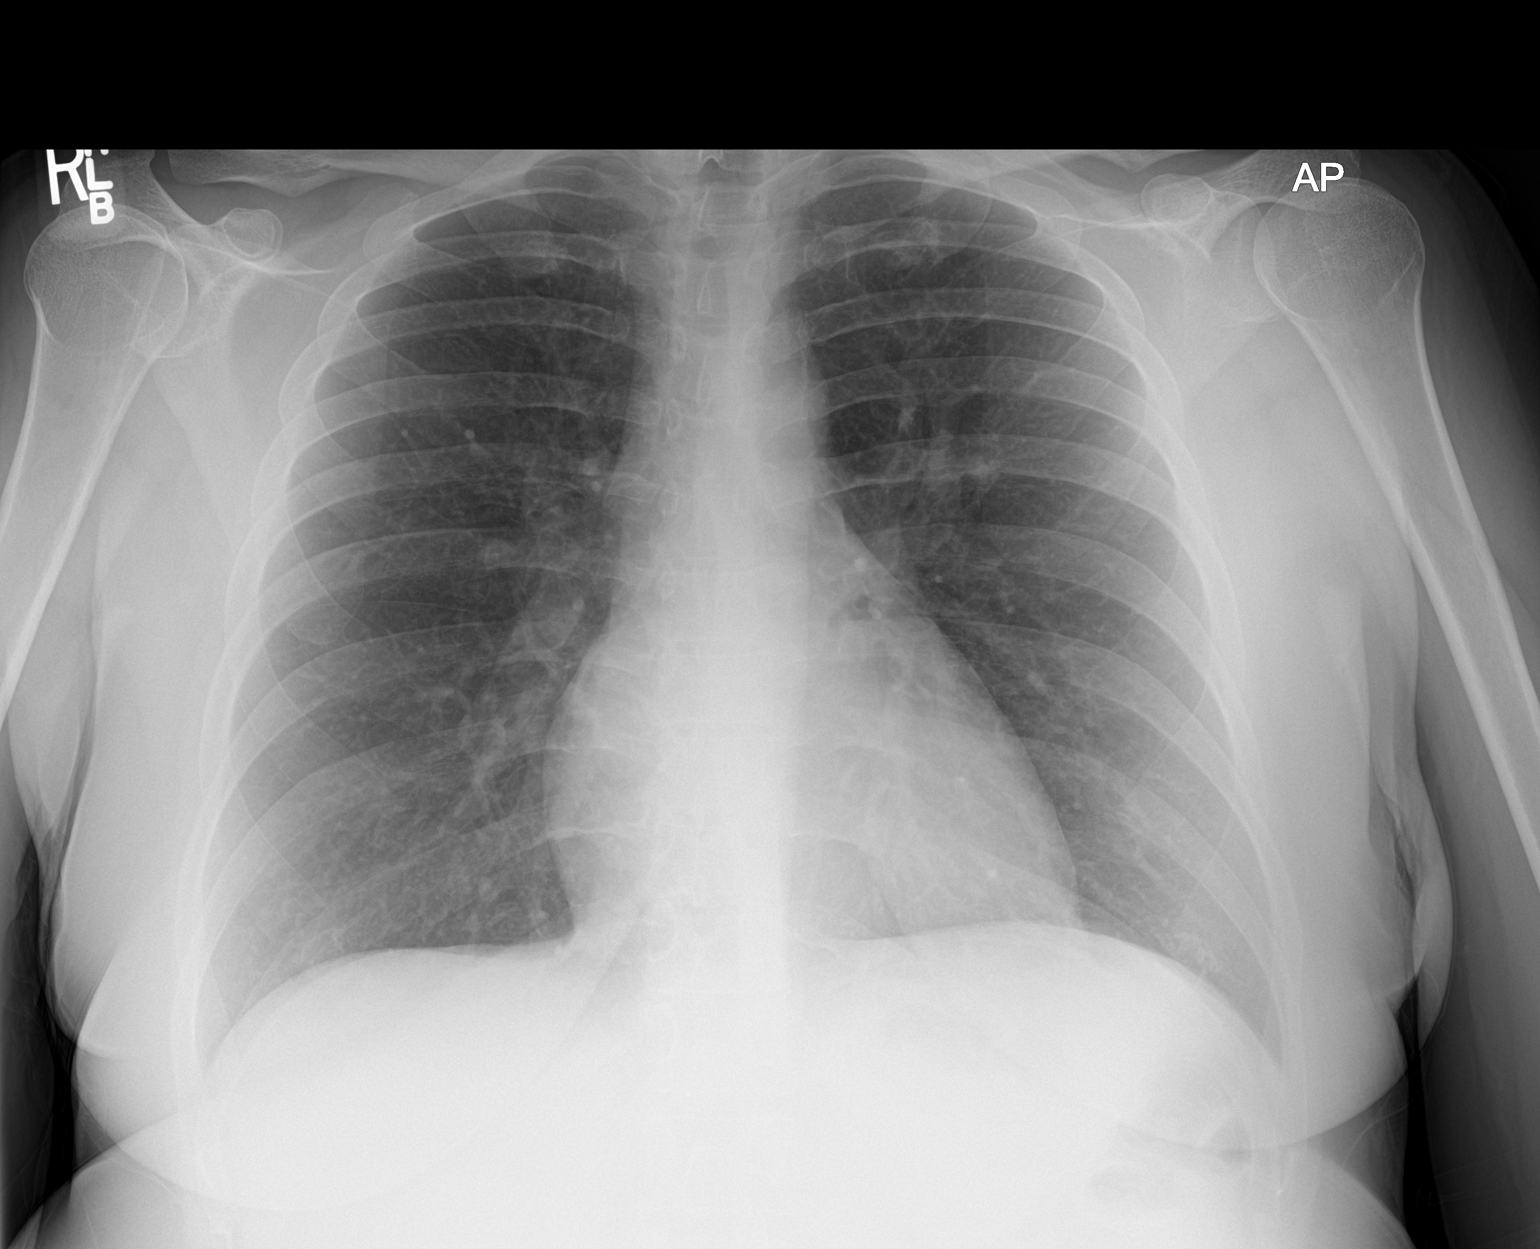

[1 of 1 positions shown; findings below may reference images not displayed]

FINDINGS: The heart size and mediastinal contours are within normal limits.
Both lungs are clear. The visualized skeletal structures are
unremarkable.
IMPRESSION: No active disease.
